# Patient Record
Sex: Male | Born: 1935 | Race: White | Hispanic: No | Smoking: Never smoker
Health system: Southern US, Community
[De-identification: ages and names within clinical notes are randomized; demographics above are authoritative.]

## PROBLEM LIST (undated history)

## (undated) DIAGNOSIS — F0390 Unspecified dementia without behavioral disturbance: Secondary | ICD-10-CM

## (undated) DIAGNOSIS — E785 Hyperlipidemia, unspecified: Secondary | ICD-10-CM

## (undated) HISTORY — DX: Hyperlipidemia, unspecified: E78.5

## (undated) HISTORY — DX: Unspecified dementia, unspecified severity, without behavioral disturbance, psychotic disturbance, mood disturbance, and anxiety: F03.90

---

## 2003-11-24 ENCOUNTER — Other Ambulatory Visit: Payer: Self-pay

## 2003-11-26 ENCOUNTER — Other Ambulatory Visit: Payer: Self-pay

## 2006-01-23 ENCOUNTER — Ambulatory Visit: Payer: Self-pay | Admitting: Urology

## 2006-02-03 ENCOUNTER — Ambulatory Visit: Payer: Self-pay | Admitting: Radiation Oncology

## 2006-03-28 ENCOUNTER — Ambulatory Visit: Payer: Self-pay | Admitting: Specialist

## 2006-04-11 ENCOUNTER — Ambulatory Visit: Payer: Self-pay | Admitting: Radiation Oncology

## 2006-04-19 ENCOUNTER — Ambulatory Visit: Payer: Self-pay | Admitting: Radiation Oncology

## 2006-04-28 ENCOUNTER — Ambulatory Visit: Payer: Self-pay | Admitting: Specialist

## 2006-05-20 ENCOUNTER — Ambulatory Visit: Payer: Self-pay | Admitting: Radiation Oncology

## 2006-06-19 ENCOUNTER — Ambulatory Visit: Payer: Self-pay | Admitting: Radiation Oncology

## 2006-07-20 ENCOUNTER — Ambulatory Visit: Payer: Self-pay | Admitting: Radiation Oncology

## 2006-08-19 ENCOUNTER — Ambulatory Visit: Payer: Self-pay | Admitting: Radiation Oncology

## 2006-11-22 ENCOUNTER — Ambulatory Visit: Payer: Self-pay | Admitting: Radiation Oncology

## 2006-12-19 ENCOUNTER — Ambulatory Visit: Payer: Self-pay | Admitting: Radiation Oncology

## 2007-02-02 ENCOUNTER — Ambulatory Visit: Payer: Self-pay | Admitting: Surgery

## 2007-02-02 ENCOUNTER — Other Ambulatory Visit: Payer: Self-pay

## 2007-02-09 ENCOUNTER — Ambulatory Visit: Payer: Self-pay | Admitting: Surgery

## 2007-05-21 ENCOUNTER — Ambulatory Visit: Payer: Self-pay | Admitting: Radiation Oncology

## 2007-05-24 ENCOUNTER — Ambulatory Visit: Payer: Self-pay | Admitting: Radiation Oncology

## 2007-06-20 ENCOUNTER — Ambulatory Visit: Payer: Self-pay | Admitting: Radiation Oncology

## 2007-11-18 ENCOUNTER — Ambulatory Visit: Payer: Self-pay | Admitting: Radiation Oncology

## 2007-11-26 ENCOUNTER — Ambulatory Visit: Payer: Self-pay | Admitting: Radiation Oncology

## 2007-12-19 ENCOUNTER — Ambulatory Visit: Payer: Self-pay | Admitting: Radiation Oncology

## 2009-07-27 ENCOUNTER — Ambulatory Visit: Payer: Self-pay | Admitting: Family Medicine

## 2009-08-10 ENCOUNTER — Emergency Department: Payer: Self-pay | Admitting: Unknown Physician Specialty

## 2010-12-08 ENCOUNTER — Inpatient Hospital Stay: Payer: Self-pay | Admitting: Internal Medicine

## 2012-07-23 ENCOUNTER — Ambulatory Visit: Payer: Self-pay | Admitting: Family Medicine

## 2013-01-31 ENCOUNTER — Emergency Department: Payer: Self-pay | Admitting: Emergency Medicine

## 2013-05-23 ENCOUNTER — Encounter: Payer: Self-pay | Admitting: General Surgery

## 2013-05-23 ENCOUNTER — Ambulatory Visit (INDEPENDENT_AMBULATORY_CARE_PROVIDER_SITE_OTHER): Payer: Medicare Other | Admitting: General Surgery

## 2013-05-23 VITALS — BP 136/72 | HR 50 | Resp 12 | Ht 69.0 in | Wt 160.0 lb

## 2013-05-23 DIAGNOSIS — L0231 Cutaneous abscess of buttock: Secondary | ICD-10-CM | POA: Insufficient documentation

## 2013-05-23 NOTE — Progress Notes (Signed)
Patient ID: Jerome Hines, male   DOB: [birth year], 78 y.o.   MRN: [id number]  Chief Complaint  Patient presents with  . Other    perirectal abscess    HPI [name] is a 78 y.o. male.  Here today for perirectal abscess for about 1 week.  Some rectal pain and bleeding.  Denies nausea, vomiting or diarrhea. He is on a stool softener but does have trouble with constipation. He is a resident of [facility] in memory care. He has dementia and is accompanied by his daughter who is his POA.  HPI  Past Medical History  Diagnosis Date  . Dementia   . Hyperlipidemia     History reviewed. No pertinent past surgical history.  Family History  Problem Relation Age of Onset  . Stroke Mother     Social History History  Substance Use Topics  . Smoking status: Never Smoker   . Smokeless tobacco: Not on file  . Alcohol Use: No    No Known Allergies  Current Outpatient Prescriptions  Medication Sig Dispense Refill  . acetaminophen (TYLENOL) 650 MG CR tablet Take 650 mg by mouth every 8 (eight) hours as needed for pain.      Marland Kitchen aluminum-magnesium hydroxide 200-200 MG/5ML suspension Take 15 mLs by mouth every 6 (six) hours as needed for indigestion.      Marland Kitchen aspirin 81 MG chewable tablet Chew 81 mg by mouth daily.      Marland Kitchen atorvastatin (LIPITOR) 20 MG tablet Take 20 mg by mouth daily.      . citalopram (CELEXA) 20 MG tablet Take 20 mg by mouth daily.      . divalproex (DEPAKOTE SPRINKLE) 125 MG capsule Take 125 mg by mouth 2 (two) times daily.      Marland Kitchen docusate (COLACE) 60 MG/15ML syrup Take 60 mg by mouth daily.      Marland Kitchen guaiFENesin-codeine (ROBITUSSIN AC) 100-10 MG/5ML syrup Take 5 mLs by mouth 3 (three) times daily as needed for cough.      . levothyroxine (SYNTHROID, LEVOTHROID) 100 MCG tablet Take 100 mcg by mouth daily before breakfast.      . loperamide (IMODIUM A-D) 2 MG tablet Take 2 mg by mouth 4 (four) times daily as needed for diarrhea or loose stools.      . Magnesium Hydroxide  (MILK OF MAGNESIA PO) Take by mouth.      . meloxicam (MOBIC) 7.5 MG tablet Take 7.5 mg by mouth daily.      . memantine (NAMENDA TITRATION PACK) tablet pack Take by mouth See admin instructions. 5 mg/day for =1 week; 5 mg twice daily for =1 week; 15 mg/day given in 5 mg and 10 mg separated doses for =1 week; then 10 mg twice daily      . metoprolol (LOPRESSOR) 50 MG tablet Take 50 mg by mouth 2 (two) times daily.      . rivastigmine (EXELON) 4.6 mg/24hr Place 1 patch onto the skin daily.      . tamsulosin (FLOMAX) 0.4 MG CAPS capsule Take by mouth.       No current facility-administered medications for this visit.    Review of Systems Review of Systems  Constitutional: Negative.   Respiratory: Negative.   Cardiovascular: Negative.   Gastrointestinal: Positive for rectal pain.    Blood pressure 136/72, pulse 50, resp. rate 12, height 5\' 9"  (1.753 m), weight 160 lb (72.576 kg).  Physical Exam Physical Exam  Constitutional: He appears well-developed and well-nourished.  Genitourinary:     Neurological: He is alert.  Pleasantly confused.  Skin: Skin is warm and dry.    Data Reviewed None  Assessment    Abscess in the right gluteal region.      Plan    Abscess was drained today. Antibiotic prescribed. Patient to return as needed.      With pt in left lateral position, the abscess area was prepped with betadine. 1 ml of 1% xylocaine instilled. 1cm incision made and 2-3 ml of blood stained seropurulent material drained. Dressed with 4/4s. Doxycycline 100 mg bid for one week. Toyna Erisman G 05/23/2013, 6:37 PM

## 2013-05-23 NOTE — Patient Instructions (Addendum)
Patient to return as needed. 

## 2014-05-06 ENCOUNTER — Emergency Department: Payer: Self-pay | Admitting: Emergency Medicine

## 2014-05-06 LAB — TROPONIN I

## 2014-05-06 LAB — CBC
HCT: 35.6 % — AB (ref 40.0–52.0)
HGB: 11.8 g/dL — ABNORMAL LOW (ref 13.0–18.0)
MCH: 34 pg (ref 26.0–34.0)
MCHC: 33.1 g/dL (ref 32.0–36.0)
MCV: 103 fL — AB (ref 80–100)
PLATELETS: 200 10*3/uL (ref 150–440)
RBC: 3.47 10*6/uL — ABNORMAL LOW (ref 4.40–5.90)
RDW: 12.5 % (ref 11.5–14.5)
WBC: 6.5 10*3/uL (ref 3.8–10.6)

## 2014-05-06 LAB — BASIC METABOLIC PANEL
ANION GAP: 10 (ref 7–16)
BUN: 26 mg/dL — ABNORMAL HIGH (ref 7–18)
Calcium, Total: 8.2 mg/dL — ABNORMAL LOW (ref 8.5–10.1)
Chloride: 106 mmol/L (ref 98–107)
Co2: 28 mmol/L (ref 21–32)
Creatinine: 1.22 mg/dL (ref 0.60–1.30)
EGFR (African American): >60
EGFR (Non-African Amer.): 57 — ABNORMAL LOW
Glucose: 88 mg/dL (ref 65–99)
OSMOLALITY: 291 (ref 275–301)
Potassium: 4.2 mmol/L (ref 3.5–5.1)
SODIUM: 144 mmol/L (ref 136–145)

## 2014-06-20 ENCOUNTER — Emergency Department: Payer: Self-pay | Admitting: Emergency Medicine

## 2014-06-20 LAB — CBC WITH DIFFERENTIAL/PLATELET
Basophil #: 0.1 10*3/uL (ref 0.0–0.1)
Basophil %: 0.8 %
EOS PCT: 3.6 %
Eosinophil #: 0.2 10*3/uL (ref 0.0–0.7)
HCT: 39.3 % — ABNORMAL LOW (ref 40.0–52.0)
HGB: 13.1 g/dL (ref 13.0–18.0)
LYMPHS ABS: 1.4 10*3/uL (ref 1.0–3.6)
Lymphocyte %: 20.7 %
MCH: 34 pg (ref 26.0–34.0)
MCHC: 33.4 g/dL (ref 32.0–36.0)
MCV: 102 fL — AB (ref 80–100)
Monocyte #: 0.7 x10 3/mm (ref 0.2–1.0)
Monocyte %: 10.7 %
NEUTROS ABS: 4.3 10*3/uL (ref 1.4–6.5)
NEUTROS PCT: 64.2 %
PLATELETS: 221 10*3/uL (ref 150–440)
RBC: 3.85 10*6/uL — ABNORMAL LOW (ref 4.40–5.90)
RDW: 12.5 % (ref 11.5–14.5)
WBC: 6.6 10*3/uL (ref 3.8–10.6)

## 2014-06-20 LAB — BASIC METABOLIC PANEL
Anion Gap: 8 (ref 7–16)
BUN: 28 mg/dL — ABNORMAL HIGH (ref 7–18)
CO2: 28 mmol/L (ref 21–32)
Calcium, Total: 8.4 mg/dL — ABNORMAL LOW (ref 8.5–10.1)
Chloride: 107 mmol/L (ref 98–107)
Creatinine: 1.19 mg/dL (ref 0.60–1.30)
EGFR (African American): >60
EGFR (Non-African Amer.): >60
Glucose: 89 mg/dL (ref 65–99)
OSMOLALITY: 290 (ref 275–301)
Potassium: 4.4 mmol/L (ref 3.5–5.1)
SODIUM: 143 mmol/L (ref 136–145)

## 2014-06-20 LAB — TROPONIN I

## 2014-07-17 ENCOUNTER — Emergency Department: Payer: Self-pay | Admitting: Emergency Medicine

## 2014-07-17 LAB — URINALYSIS, COMPLETE
BILIRUBIN, UR: NEGATIVE
Blood: NEGATIVE
GLUCOSE, UR: NEGATIVE mg/dL (ref 0–75)
KETONE: NEGATIVE
LEUKOCYTE ESTERASE: NEGATIVE
NITRITE: NEGATIVE
PH: 7 (ref 4.5–8.0)
Protein: NEGATIVE
RBC,UR: 3 /HPF (ref 0–5)
Specific Gravity: 1.019 (ref 1.003–1.030)
Squamous Epithelial: NONE SEEN

## 2014-07-17 LAB — BASIC METABOLIC PANEL
ANION GAP: 7 (ref 7–16)
BUN: 26 mg/dL — AB (ref 7–18)
CALCIUM: 7.9 mg/dL — AB (ref 8.5–10.1)
CHLORIDE: 108 mmol/L — AB (ref 98–107)
CREATININE: 1.1 mg/dL (ref 0.60–1.30)
Co2: 29 mmol/L (ref 21–32)
EGFR (African American): >60
Glucose: 90 mg/dL (ref 65–99)
OSMOLALITY: 291 (ref 275–301)
Potassium: 4.2 mmol/L (ref 3.5–5.1)
SODIUM: 144 mmol/L (ref 136–145)

## 2014-07-17 LAB — TROPONIN I: Troponin-I: 0.02 ng/mL

## 2014-07-17 LAB — CBC
HCT: 36.4 % — ABNORMAL LOW (ref 40.0–52.0)
HGB: 12.3 g/dL — ABNORMAL LOW (ref 13.0–18.0)
MCH: 34.1 pg — AB (ref 26.0–34.0)
MCHC: 33.9 g/dL (ref 32.0–36.0)
MCV: 101 fL — AB (ref 80–100)
PLATELETS: 184 10*3/uL (ref 150–440)
RBC: 3.62 10*6/uL — ABNORMAL LOW (ref 4.40–5.90)
RDW: 12.6 % (ref 11.5–14.5)
WBC: 6.9 10*3/uL (ref 3.8–10.6)

## 2014-08-30 ENCOUNTER — Emergency Department: Payer: Self-pay | Admitting: Emergency Medicine

## 2014-08-30 LAB — COMPREHENSIVE METABOLIC PANEL
ANION GAP: 6 — AB (ref 7–16)
Albumin: 3.2 g/dL — ABNORMAL LOW (ref 3.4–5.0)
Alkaline Phosphatase: 95 U/L
BILIRUBIN TOTAL: 0.7 mg/dL (ref 0.2–1.0)
BUN: 21 mg/dL — AB (ref 7–18)
CALCIUM: 8.4 mg/dL — AB (ref 8.5–10.1)
Chloride: 105 mmol/L (ref 98–107)
Co2: 29 mmol/L (ref 21–32)
Creatinine: 1.16 mg/dL (ref 0.60–1.30)
EGFR (African American): >60
EGFR (Non-African Amer.): >60
Glucose: 90 mg/dL (ref 65–99)
Osmolality: 282 (ref 275–301)
POTASSIUM: 4.1 mmol/L (ref 3.5–5.1)
SGOT(AST): 28 U/L (ref 15–37)
SGPT (ALT): 15 U/L
Sodium: 140 mmol/L (ref 136–145)
Total Protein: 7.6 g/dL (ref 6.4–8.2)

## 2014-08-30 LAB — CBC
HCT: 41.6 % (ref 40.0–52.0)
HGB: 13.9 g/dL (ref 13.0–18.0)
MCH: 34 pg (ref 26.0–34.0)
MCHC: 33.5 g/dL (ref 32.0–36.0)
MCV: 102 fL — AB (ref 80–100)
Platelet: 195 10*3/uL (ref 150–440)
RBC: 4.09 10*6/uL — AB (ref 4.40–5.90)
RDW: 13.2 % (ref 11.5–14.5)
WBC: 9.4 10*3/uL (ref 3.8–10.6)

## 2014-08-30 LAB — URINALYSIS, COMPLETE
BACTERIA: NONE SEEN
BLOOD: NEGATIVE
Bilirubin,UR: NEGATIVE
GLUCOSE, UR: NEGATIVE mg/dL (ref 0–75)
Ketone: NEGATIVE
Leukocyte Esterase: NEGATIVE
NITRITE: NEGATIVE
Ph: 5 (ref 4.5–8.0)
Protein: NEGATIVE
RBC,UR: 4 /HPF (ref 0–5)
SPECIFIC GRAVITY: 1.023 (ref 1.003–1.030)
Squamous Epithelial: NONE SEEN
WBC UR: 1 /HPF (ref 0–5)

## 2014-08-30 LAB — TROPONIN I: TROPONIN-I: 0.03 ng/mL

## 2014-11-06 ENCOUNTER — Inpatient Hospital Stay: Payer: Self-pay | Admitting: Internal Medicine

## 2014-11-11 DIAGNOSIS — I2510 Atherosclerotic heart disease of native coronary artery without angina pectoris: Secondary | ICD-10-CM

## 2014-11-11 DIAGNOSIS — N400 Enlarged prostate without lower urinary tract symptoms: Secondary | ICD-10-CM

## 2014-11-11 DIAGNOSIS — G301 Alzheimer's disease with late onset: Secondary | ICD-10-CM

## 2014-11-11 DIAGNOSIS — S7292XA Unspecified fracture of left femur, initial encounter for closed fracture: Secondary | ICD-10-CM

## 2014-11-11 DIAGNOSIS — F419 Anxiety disorder, unspecified: Secondary | ICD-10-CM

## 2014-11-25 ENCOUNTER — Emergency Department: Payer: Self-pay | Admitting: Emergency Medicine

## 2014-11-26 DIAGNOSIS — R0602 Shortness of breath: Secondary | ICD-10-CM | POA: Diagnosis not present

## 2014-11-26 DIAGNOSIS — R079 Chest pain, unspecified: Secondary | ICD-10-CM | POA: Diagnosis not present

## 2014-12-16 DIAGNOSIS — N400 Enlarged prostate without lower urinary tract symptoms: Secondary | ICD-10-CM | POA: Diagnosis not present

## 2014-12-16 DIAGNOSIS — G301 Alzheimer's disease with late onset: Secondary | ICD-10-CM | POA: Diagnosis not present

## 2014-12-16 DIAGNOSIS — F0390 Unspecified dementia without behavioral disturbance: Secondary | ICD-10-CM | POA: Diagnosis not present

## 2014-12-16 DIAGNOSIS — I2510 Atherosclerotic heart disease of native coronary artery without angina pectoris: Secondary | ICD-10-CM | POA: Diagnosis not present

## 2014-12-16 DIAGNOSIS — F418 Other specified anxiety disorders: Secondary | ICD-10-CM

## 2015-01-07 DIAGNOSIS — F419 Anxiety disorder, unspecified: Secondary | ICD-10-CM

## 2015-01-07 DIAGNOSIS — G309 Alzheimer's disease, unspecified: Secondary | ICD-10-CM | POA: Diagnosis not present

## 2015-01-07 DIAGNOSIS — E039 Hypothyroidism, unspecified: Secondary | ICD-10-CM | POA: Diagnosis not present

## 2015-01-07 DIAGNOSIS — I2510 Atherosclerotic heart disease of native coronary artery without angina pectoris: Secondary | ICD-10-CM | POA: Diagnosis not present

## 2015-01-07 DIAGNOSIS — N6009 Solitary cyst of unspecified breast: Secondary | ICD-10-CM | POA: Diagnosis not present

## 2015-01-18 NOTE — H&P (Signed)
PATIENT NAME:  Jerome Hines, Jerome Hines MR#:  [id number] DATE OF BIRTH:  23-Jun-1935  DATE OF ADMISSION:  11/05/2014  REFERRING PHYSICIAN: Darien Ramusavid W. Kaminski, MD  PRIMARY CARE PHYSICIAN: Richard L. Sullivan LoneGilbert, MD   CHIEF COMPLAINT: Fall   HISTORY OF PRESENT ILLNESS: A 80 year old Caucasian gentleman with history of advanced dementia; hypothyroidism, unspecified; coronary artery disease, status post CABG presenting after mechanical fall. The patient resides at [facility]. Unable to provide any meaningful information given mental status and medical condition. Had a witnessed fall today. Then noticed grimacing with him to move his leg. Went to the Emergency Department for further workup and evaluation and found to have a left intertrochanteric fracture. Once again, the patient is unable to provide any meaningful information given mental status and medical condition.   REVIEW OF SYSTEMS: Unobtainable given mental status and medical condition.   PAST MEDICAL HISTORY: Advanced dementia, hypothyroidism, coronary artery disease status post CABG.   SOCIAL HISTORY: Per documentation, he is ambulatory, confused at baseline; however, is somewhat verbal. He is incontinent at baseline as well.   FAMILY HISTORY: Unobtainable, given the patient's mental status and medical condition.   ALLERGIES: No known drug allergies.   HOME MEDICATIONS: Include aspirin 81 mg p.Hines. daily, Tylenol 500 mg p.Hines. q. 4 hours as needed for pain, meloxicam 7.5 mg p.Hines. daily, milk of magnesia 30 mL as needed for constipation, Flomax 0.4 mg p.Hines. daily, divalproex 125 mg p.Hines. at bedtime, citalopram 20 mg p.Hines. daily, loratadine 10 mg p.Hines. daily, atorvastatin 20 mg p.Hines. daily, metoprolol 50 mg p.Hines. daily, Exelon patch 13.3 mg transdermal daily, nystatin topical apply to affected area b.i.d., Colace 10 mL daily, Namenda extended-release 28 mg daily, levothyroxine 100 mcg p.Hines. daily.   PHYSICAL EXAMINATION:  VITAL SIGNS: Temperature  97.6, heart rate 69, respirations 22, blood pressure 151/81, saturating 100% on room air. Weight 68 kg, BMI 20.4.  GENERAL: A weak -appearing Caucasian gentleman, currently in no acute distress.  HEAD: Normocephalic, atraumatic.  EYES: Pupils equal, round, and reactive to light. Extraocular muscles intact. No scleral icterus.  MOUTH: Dry mucosal membrane. Dentition intact. No abscess noted.  EAR, NOSE, AND THROAT: Clear without exudates. No external lesions.  NECK: Supple. No thyromegaly. No nodules. No JVD.  PULMONARY: Clear to auscultation bilaterally without wheezes, rales, or rhonchi. No use of accessory muscles. Good respiratory effort.  CHEST: Nontender to palpation.  CARDIOVASCULAR: S1, S2, regular rate and rhythm. There is a 3/6 systolic ejection murmur best heard at the left upper sternal border. No edema noted. Pedal pulses 2+ bilaterally.  GASTROINTESTINAL: Soft, nontender, nondistended. No masses. Positive bowel sounds. No hepatosplenomegaly.  MUSCULOSKELETAL: No swelling, clubbing, or edema. Left lower extremity is currently internally rotated, range of motion limited secondary to pain.  NEUROLOGIC: Unable to fully assess given the patient's mental status and medical condition. He is unable to follow simple commands.  SKIN: No ulceration, lesions, rashes, or cyanosis. Skin warm, dry. Turgor intact.   PSYCHIATRIC: Unable to fully assess given the patient's mental status and medical condition. Mood, affect flat. He is awake; however, he is currently nonverbal.   LABORATORY AND RADIOGRAPHIC DATA: X-ray of the left femur reveals mildly displaced proximal left intertrochanteric fracture. Chest x-ray reveals mild cardiomegaly with mild chronic interstitial lung disease, no acute findings. EKG performed with evidence of LVH with first degree AV block, no ST-T wave abnormalities. Remainder of laboratory data: Sodium 142, potassium 3.9, chloride 107, bicarbonate 32, BUN 24, creatinine 1.29,  glucose 97,  albumin 2.9, WBC 9.3, hemoglobin of 12.7.   ASSESSMENT AND PLAN: A 80 year old Caucasian male with a history of advanced dementia, presenting after a fall.  1.  Preoperative evaluation for open reduction internal fixation of left intertrochanteric fracture. We will consult orthopedics. The case has already been discussed in the Emergency Department. The patient should be considered a moderate risk for moderate risk surgery from a cardiac standpoint. Has no active of congestive heart failure, arrhythmias, does have a 3/6 systolic ejection murmur, however, without any evidence of cardiac compromise secondary to his valvular dysfunction. His METs considered less than 4 given generalized dysmobility. No further testing or treatment is required prior to surgery.  2.  Coronary artery disease status post coronary artery bypass grafting. Hold his aspirin preoperatively, may restart post. Continue with statin and beta blockade.  3.  Hypothyroidism, unspecified: Continue with Synthroid.  4.  Dementia. Continue with home dosages of medications including Exelon patch as well as Namenda.  5.  Venous thromboembolism prophylaxis. Currently sequential compression devices.   CODE STATUS: The patient is full code.   TIME SPENT: 45 minutes.    ____________________________ Cletis Athens. Rumeal Cullipher, MD dkh:bm D: 11/05/2014 23:58:00 ET T: 11/06/2014 00:23:48 ET JOB#: [id number]  cc: Cletis Athens. Jehiel Koepp, MD, <Dictator> Diesha Rostad Synetta Shadow MD ELECTRONICALLY SIGNED 11/12/2014 21:09

## 2015-01-18 NOTE — Discharge Summary (Signed)
PATIENT NAME:  Jerome Hines, Jerome Hines MR#:  [id number] DATE OF BIRTH:  [birth year]  DATE OF ADMISSION:  11/06/2014 DATE OF DISCHARGE:  11/10/2014  PRESENTING COMPLAINT: Fall.  PRIMARY CARE PHYSICIAN: Jerome Mansonichard Gilbert, MD  DISCHARGE DIAGNOSIS: Left hip intertrochanteric fracture status post nailing/pinning, by Dr. Hyacinth Hines.  CODE STATUS: Full.  DISCHARGE MEDICATIONS: 1.  Atorvastatin 20 mg p.Hines. daily.  2.  Citalopram 20 mg daily.  3.  Docusate 10 mL p.Hines. daily.  4.  Exelon transdermal patch daily.  5.  Levothyroxine 100 mcg daily.  6.  Loratadine 10 mg daily.  7.  Metoprolol extended release 50 mg daily.  8.  Namenda XR 28 mg at bedtime.  9.  Tamsulosin 0.4 mg daily.  10.  Milk of magnesia 30 mL at bedtime as needed.  11.  Nystatin/triamcinolone topical cream apply to effected area b.i.d. as needed.  12.  Riesman topical ointment to effected area b.i.d.  13.  Acetaminophen 325 mg 2 tablets every 4 hours as needed.  14.  Alendronate 70 mg once a week.  15.  Ferrous sulfate 325 b.i.d.  16.  Calcium with vitamin D 500/200 one tablet b.i.d.  17.  Aspirin 325 b.i.d. for 6 weeks then 81 mg daily. This is recommendation by Dr. Hyacinth Hines for DVT prophylaxis.   DISCHARGE INSTRUCTIONS: 1.  Physical therapy.  2.  Follow up with Jerome Hines in 1 to 2 weeks.  3.  Follow up with Dr. Julieanne Mansonichard Hines after discharge from rehab.   DIAGNOSTIC DATA: Hemoglobin at discharge was 10.6.   BRIEF SUMMARY OF HOSPITAL COURSE: Jerome Hines is a 80 year old patient with advanced dementia and high blood pressure who comes in with fall and hip fracture. He was admitted with:  1.  Left hip intertrochanteric fracture status post fall, postop day 4, doing well. He is status post nailing/pinning by Dr. Hyacinth Hines. Dr. Hyacinth Hines recommends aspirin 325 b.i.d. for 6 weeks then resume 81 mg daily. The patient received Lovenox for DVT prophylaxis in the hospital. Physical therapy recommends rehab. [facility].  2.  Dementia. Continue Namenda and Exelon patch.  3.  Depression, mood disorder. On Depakote and Celexa.  4.  Hypothyroidism. On Synthroid.  5.  Coronary artery disease status post CABG. Stable. Continue Toprol and aspirin.   Hospital stay otherwise remained stable. The patient remained a FULL code. The discharge plan was discussed with Dr. Hyacinth Hines and the patient's daughter who was present in the room.   TIME SPENT: 40 minutes.   ____________________________ Jerome HailSona A. Allena KatzPatel, MD sap:sb D: 11/10/2014 10:30:54 ET T: 11/10/2014 11:16:11 ET JOB#: [id number]  cc: Jerome Hines A. Allena KatzPatel, MD, <Dictator> Jerome L. Sullivan LoneGilbert, MD Jerome OraSONA A Banjamin Stovall MD ELECTRONICALLY SIGNED 11/18/2014 17:33

## 2015-01-18 NOTE — Consult Note (Signed)
Brief Consult Note: Diagnosis: Left intertrochanteric hip fracture.   Patient was seen by consultant.   Recommend to proceed with surgery or procedure.   Recommend further assessment or treatment.   Orders entered.   Comments: 80 year old male with dementia has suffered a left intertrochanteric hip fracture.  Have discussed treatment with the patient's daughter who wishes to proceed with surgical fixation of the fracture. I have had a long history with the patient who tries to remain active.  Risks and benefits of surgery were discussed at length including but not limited to infection, non union, nerve or blood vessed damage, non union, need for repeat surgery, blood clots and lung emboli, and death.   Exam:  Sleepy but arousable. circulation/sensation/motor function good left leg.  Leg short and rotated with pain on range of motion.  Skin intact.  No other injury noted.    X-rays: as above  Rx:  open reduction and internal fixation left hip.  Electronic Signatures: Valinda HoarMiller, Tyann Niehaus E (MD)  (Signed 18-Feb-16 09:06)  Authored: Brief Consult Note   Last Updated: 18-Feb-16 09:06 by Valinda HoarMiller, Garlon Tuggle E (MD)

## 2015-01-18 NOTE — Op Note (Signed)
PATIENT NAME:  Jerome Hines, Jerome Hines MR#:  [id number] DATE OF BIRTH:  01/25/1935  DATE OF PROCEDURE:  11/06/2014  PREOPERATIVE DIAGNOSIS: Displaced intertrochanteric fracture of the left hip.   POSTOPERATIVE DIAGNOSIS:  Displaced intertrochanteric fracture of the left hip.  PROCEDURE PERFORMED: Open reduction internal fixation left hip with a Synthes trochanteric fixation nail (130 degrees/11 mm rod, 105 mm helical blade, 40 mm distal screw).   SURGEON: Valinda HoarHoward E. Arlyce Circle, MD   ANESTHESIA: Spinal.   COMPLICATIONS: None.   DRAINS: None.   ESTIMATED BLOOD LOSS: 100 mL.  REPLACEMENT: None.   DESCRIPTION OF PROCEDURE: The patient was brought to the Operating Room where he underwent satisfactory spinal anesthesia, was placed in the supine position and padded appropriately on the fracture table. The right leg was flexed and abducted and the left leg was placed in traction and internally rotated. Fluoroscopy showed good alignment of the fracture for surgery. The hip was prepped and draped in sterile fashion, and a longitudinal incision was made just above the trochanter. A guidepin was inserted and a 17 mm reamer was used to enlarge the opening here. The guidepin was passed down the shaft and 130 degree/11 mm rod was inserted at the appropriate depth. A second incision was made distally and the aiming device inserted and tightened snugly. The guidepin was inserted under fluoroscopic control into good position in the center of the femoral head and neck. The lateral cortex was drilled and the neck was drilled to 105 mm. A 105 mm helical blade was inserted and seated fully with the angulation corrected. The set screw was tightened down from above and fluoroscopy showed the hardware and the fracture site to be in good position. The distal aiming device was inserted and the distal locking screw drilled and filled with a 40 mm screw. Final fluoroscopy showed all hardware and the fracture to be in good alignment. The  outriggers were removed. The wound was irrigated. The deep fascia was closed with 0 Vicryl. Subcutaneous tissue was closed with 2-0 Vicryl, and the skin was closed with staples. Dry sterile dressing was applied. The patient was transferred to his hospital bed and taken to recovery in good condition. There was good range of motion of the hip without any crepitus.    ___________________________ Valinda HoarHoward E. Keyana Guevara, MD hem:mc D: 11/06/2014 12:04:55 ET T: 11/06/2014 12:32:25 ET JOB#: [id number]  cc: Valinda HoarHoward E. Jovanny Stephanie, MD, <Dictator> Valinda HoarHOWARD E Vernal Rutan MD ELECTRONICALLY SIGNED 11/07/2014 13:17

## 2015-01-26 DIAGNOSIS — R21 Rash and other nonspecific skin eruption: Secondary | ICD-10-CM | POA: Diagnosis not present

## 2015-03-04 ENCOUNTER — Ambulatory Visit: Payer: Self-pay | Admitting: Internal Medicine

## 2015-04-08 DIAGNOSIS — N401 Enlarged prostate with lower urinary tract symptoms: Secondary | ICD-10-CM | POA: Diagnosis not present

## 2015-04-08 DIAGNOSIS — F419 Anxiety disorder, unspecified: Secondary | ICD-10-CM

## 2015-04-08 DIAGNOSIS — G309 Alzheimer's disease, unspecified: Secondary | ICD-10-CM | POA: Diagnosis not present

## 2015-04-08 DIAGNOSIS — E039 Hypothyroidism, unspecified: Secondary | ICD-10-CM | POA: Diagnosis not present

## 2015-04-08 DIAGNOSIS — I2510 Atherosclerotic heart disease of native coronary artery without angina pectoris: Secondary | ICD-10-CM | POA: Diagnosis not present

## 2015-06-01 DIAGNOSIS — N400 Enlarged prostate without lower urinary tract symptoms: Secondary | ICD-10-CM | POA: Diagnosis not present

## 2015-06-01 DIAGNOSIS — G309 Alzheimer's disease, unspecified: Secondary | ICD-10-CM | POA: Diagnosis not present

## 2015-06-01 DIAGNOSIS — I2510 Atherosclerotic heart disease of native coronary artery without angina pectoris: Secondary | ICD-10-CM | POA: Diagnosis not present

## 2015-06-01 DIAGNOSIS — F39 Unspecified mood [affective] disorder: Secondary | ICD-10-CM | POA: Diagnosis not present

## 2015-07-03 DIAGNOSIS — L508 Other urticaria: Secondary | ICD-10-CM | POA: Diagnosis not present

## 2015-08-07 DIAGNOSIS — I2510 Atherosclerotic heart disease of native coronary artery without angina pectoris: Secondary | ICD-10-CM

## 2015-08-07 DIAGNOSIS — E039 Hypothyroidism, unspecified: Secondary | ICD-10-CM

## 2015-08-07 DIAGNOSIS — N401 Enlarged prostate with lower urinary tract symptoms: Secondary | ICD-10-CM

## 2015-08-07 DIAGNOSIS — F39 Unspecified mood [affective] disorder: Secondary | ICD-10-CM

## 2015-08-07 DIAGNOSIS — G309 Alzheimer's disease, unspecified: Secondary | ICD-10-CM | POA: Diagnosis not present

## 2015-08-17 IMAGING — CT CT HEAD WITHOUT CONTRAST
3 of 7 series · 13 of 47 positions shown, 15 images · non-contrast
Comparison: 05/06/2014; 12/08/2010

CLINICAL DATA: Unwitnessed fall earlier today. History of dementia.
Initial encounter.

EXAM:
CT HEAD WITHOUT CONTRAST
CT CERVICAL SPINE WITHOUT CONTRAST
TECHNIQUE: Multidetector CT imaging of the head and cervical spine was
performed following the standard protocol without intravenous
contrast. Multiplanar CT image reconstructions of the cervical spine
were also generated.

[Series 11: soft tissue · axial · 0.41mm/px · z∈[-152,-26]mm · 8 of 83 slices shown, 10 images]
[im 10/83  brain]
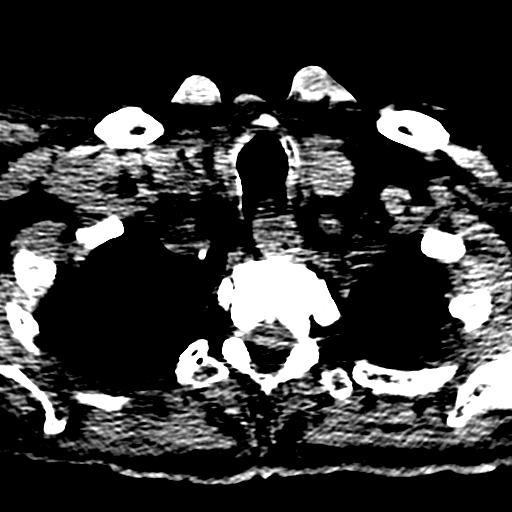
[im 10/83  bone]
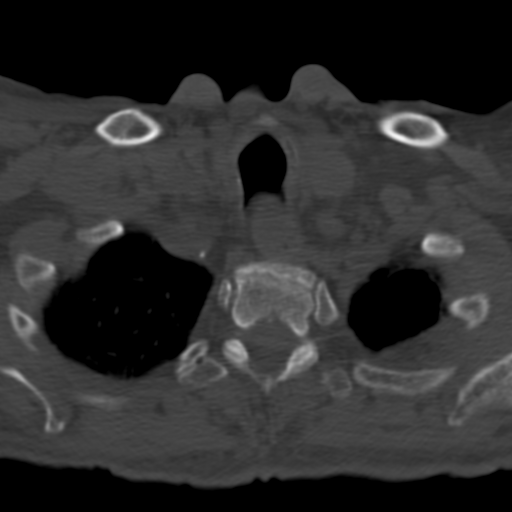
[im 19/83  brain]
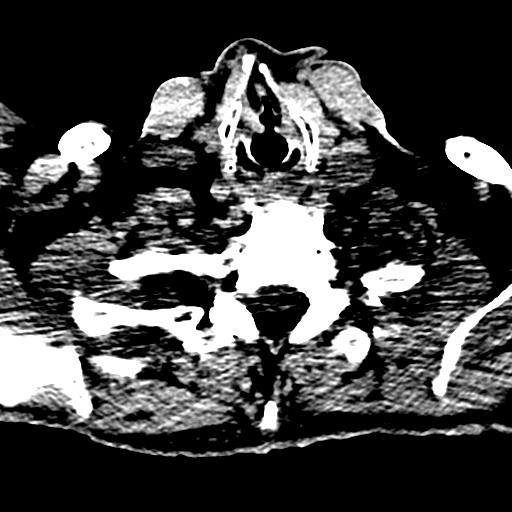
[im 28/83  brain]
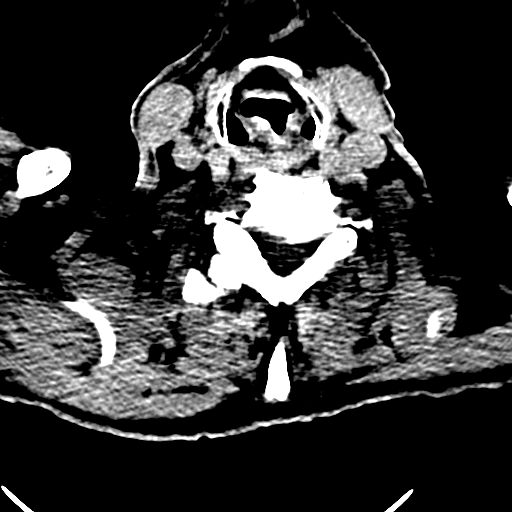
[im 37/83  brain]
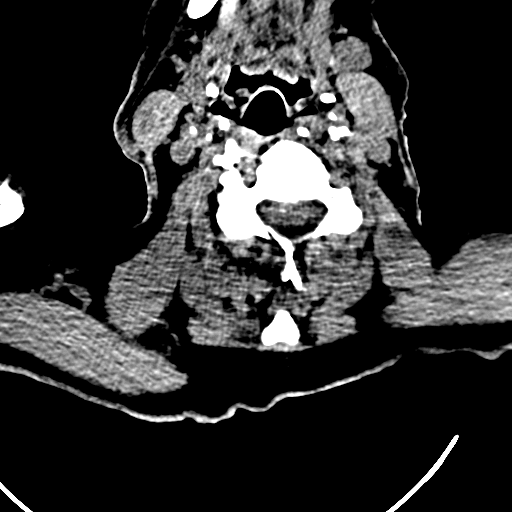
[im 46/83  brain]
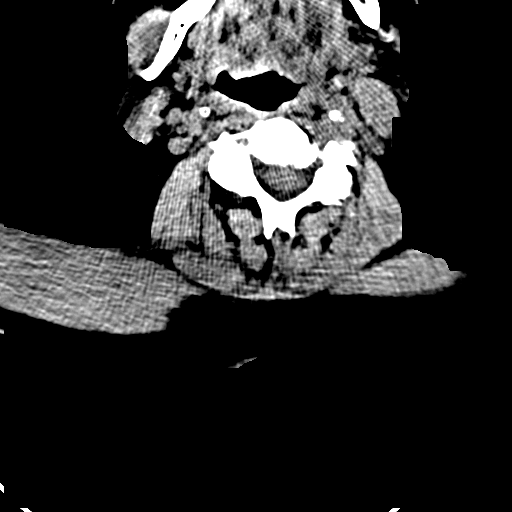
[im 46/83  bone]
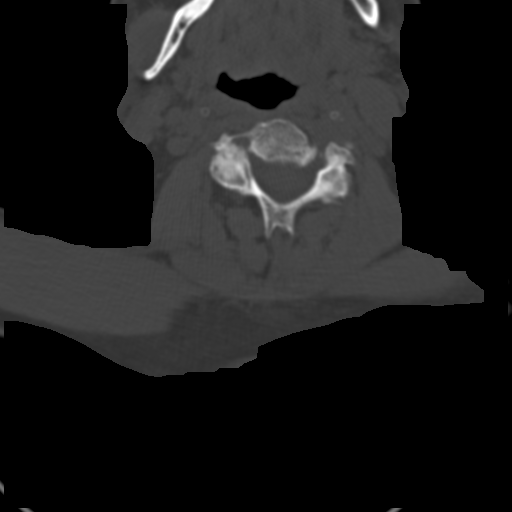
[im 55/83  brain]
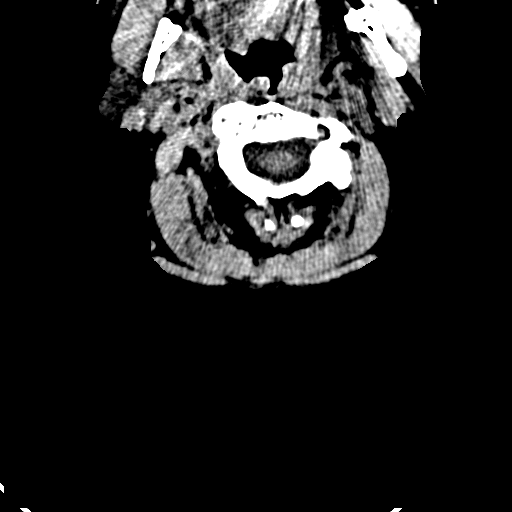
[im 64/83  brain]
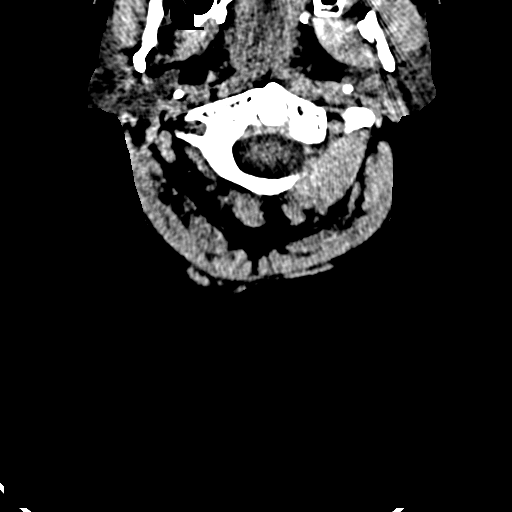
[im 73/83  brain]
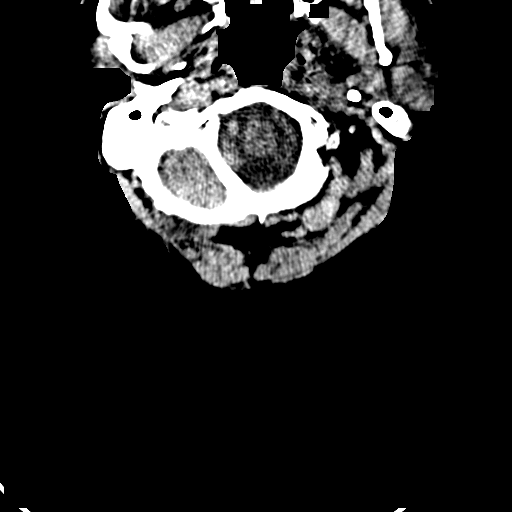

[Series 19: coronal bone · coronal · 0.21mm/px · 3 of 51 slices shown]
[im 17/51  brain]
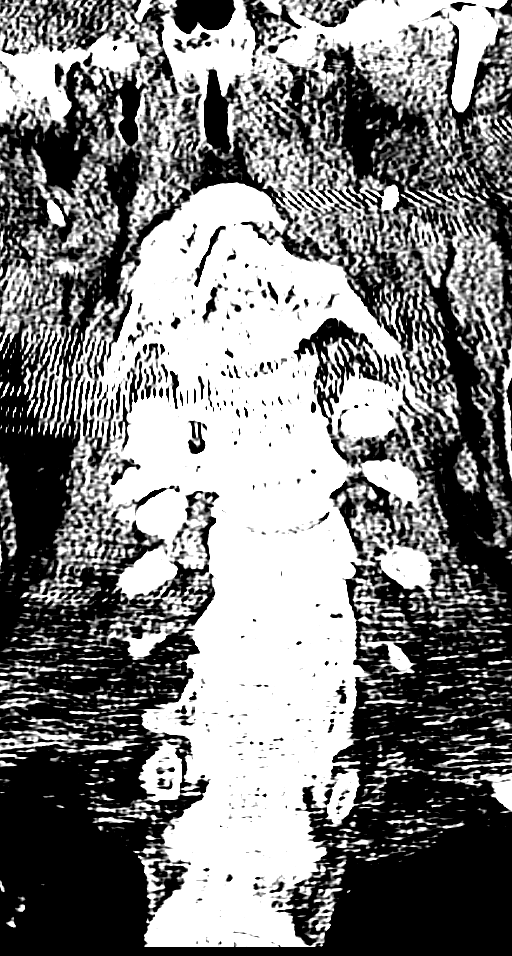
[im 23/51  brain]
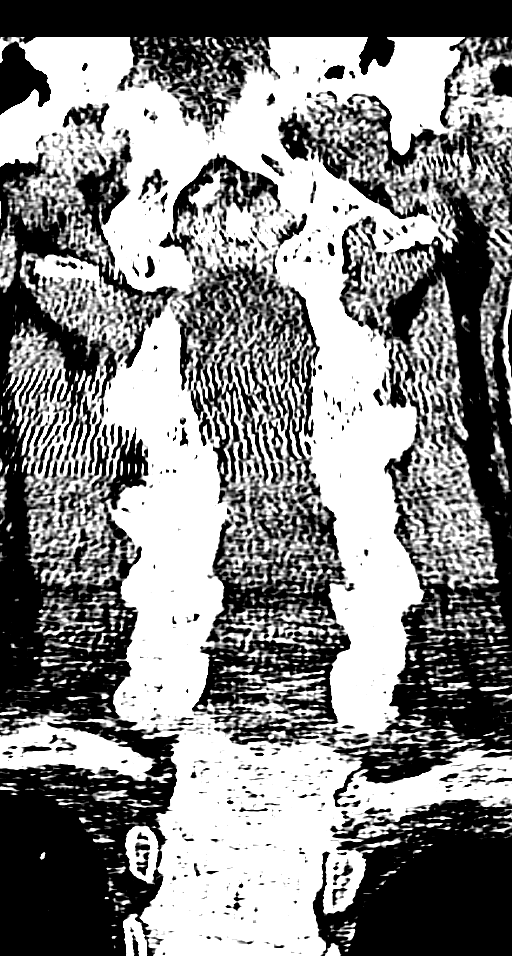
[im 28/51  brain]
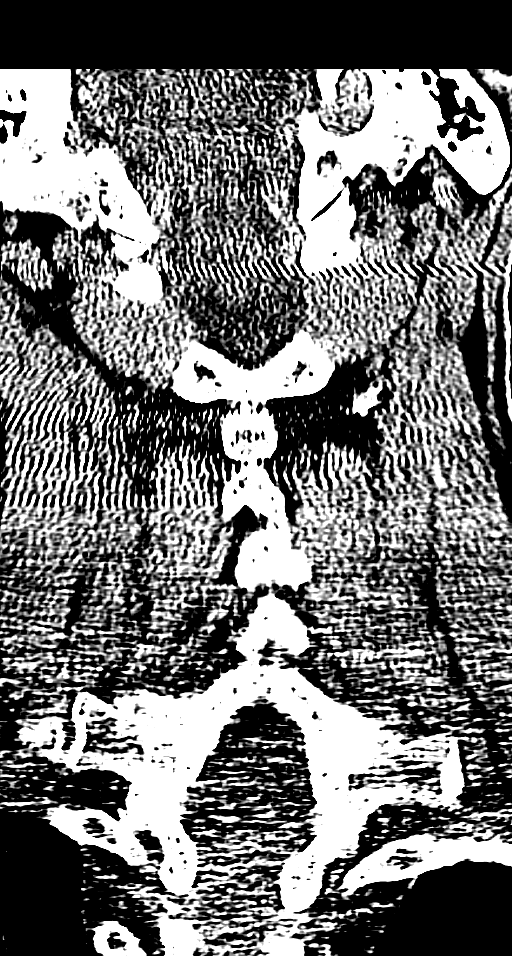

[Series 20: axial · axial · 0.24mm/px · z∈[-181,-165]mm · 2 of 86 slices shown]
[im 10/86  brain]
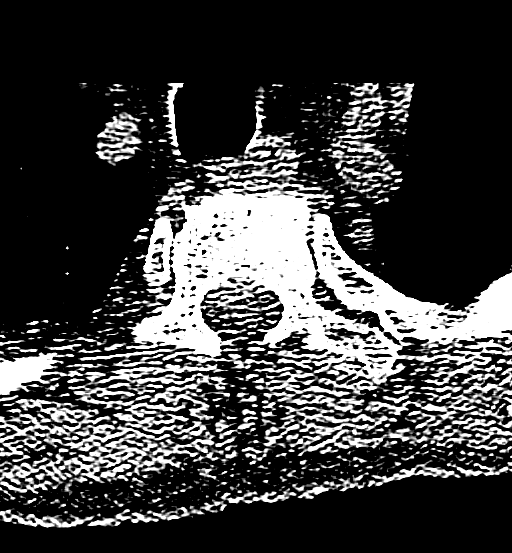
[im 19/86  brain]
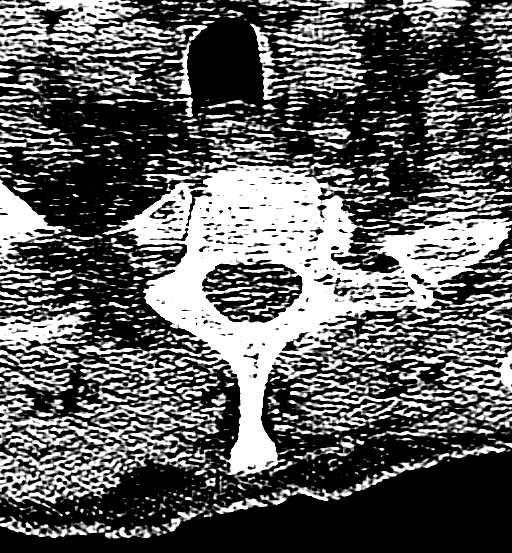

[13 of 47 positions shown; findings below may reference images not displayed]

FINDINGS: CT HEAD FINDINGS

The examination is degraded secondary to patient motion artifact
necessitating the acquisition of additional images.

Similar findings of advanced atrophy with sulcal prominence and
centralized volume loss with marked commensurate ex vacuo dilatation
of the ventricular system. Scattered periventricular hypodensities
compatible with microvascular ischemic disease. Given extensive
background parenchymal abnormalities, there is no CT evidence of
acute intracranial process. No intraparenchymal or extra-axial mass
or hemorrhage. Unchanged size and configuration of the ventricles
and basilar cisterns. No midline shift.

Post ORIF of the anterior limited visualization of the paranasal
sinuses and mastoid air cells are otherwise normal wall of the left
maxillary sinus. Shrapnel is seen within the right cheek and about
the left nasal bone, unchanged.

CT CERVICAL SPINE FINDINGS

C1 to the inferior endplate of T2 is imaged.

There is straightening of the expected cervical lordosis. No
anterolisthesis or retrolisthesis. The bilateral facets are normally
aligned. The dens is normally positioned between the lateral masses
of C1. Normal atlantodental and atlantoaxial articulations.

No fracture or static subluxation of the cervical spine. Cervical
vertebral body heights are preserved. Prevertebral soft tissues are
normal.

There is mild to moderate multilevel cervical spine DDD, worse at
C5-C6 and to a lesser extent, C6-C7 with disc space height loss,
endplate irregularity and posteriorly directed disc osteophyte
complexes at these locations.

Atherosclerotic plaque within the bilateral carotid bulbs. No bulky
cervical lymphadenopathy on this noncontrast examination. The
thyroid gland appears diminutive. Limited visualization of lung
apices is normal.
IMPRESSION: 1. Advanced atrophy and microvascular ischemic disease without
definite acute intracranial process.
2. No fracture or static subluxation of the cervical spine.
3. Mild-to-moderate multilevel cervical spine DDD, worse at C5-C6.

## 2015-10-15 DIAGNOSIS — I2510 Atherosclerotic heart disease of native coronary artery without angina pectoris: Secondary | ICD-10-CM

## 2015-10-15 DIAGNOSIS — N6009 Solitary cyst of unspecified breast: Secondary | ICD-10-CM

## 2015-10-15 DIAGNOSIS — I350 Nonrheumatic aortic (valve) stenosis: Secondary | ICD-10-CM

## 2015-10-15 DIAGNOSIS — G301 Alzheimer's disease with late onset: Secondary | ICD-10-CM | POA: Diagnosis not present

## 2015-10-15 DIAGNOSIS — F39 Unspecified mood [affective] disorder: Secondary | ICD-10-CM

## 2015-10-15 DIAGNOSIS — E039 Hypothyroidism, unspecified: Secondary | ICD-10-CM

## 2015-11-23 ENCOUNTER — Telehealth: Payer: Self-pay

## 2015-11-23 DIAGNOSIS — H00013 Hordeolum externum right eye, unspecified eyelid: Secondary | ICD-10-CM

## 2015-11-23 NOTE — Telephone Encounter (Signed)
PLEASE NOTE: All timestamps contained within this report are represented as [place] Standard Time. CONFIDENTIALTY NOTICE: This fax transmission is intended only for the addressee. It contains information that is legally privileged, confidential or otherwise protected from use or disclosure. If you are not the intended recipient, you are strictly prohibited from reviewing, disclosing, copying using or disseminating any of this information or taking any action in reliance on or regarding this information. If you have received this fax in error, please notify us immediately by telephone so that we can arrange for its return to us. Phone: [phone], Toll-Free: [phone], Fax: [phone] Page: 1 of 1 Call Id: [id number] [facility] [facility] Client TELEPHONE ADVICE RECORD [facility] Patient Name: Jerome DakinsROBERT Hines Gender: Male DOB: 1935-03-03 Age: [age] Y 7 M 14 D Return Phone Number: Address: City/State/Zip: [place] Client  [facility] [place] [facility] [place] Client Site  [facility] [place] - Day Contact Type Call Who Is Calling Patient / Member / Family / Caregiver Call Type Triage / Clinical Caller Name melinda holmes Relationship To Patient Provider Return Phone Number Please choose phone number Chief Complaint Eye Pain Reason for Call Symptomatic / Request for Health Information Initial Comment Caller States melinda w/ [facility], has pt with red swollen area on right inner right eye lid. daughter would like to see if something can get called in. Call Back # [phone] Appointment Disposition EMR Caller Not Reached Info pasted into Epic No Translation No Nurse Assessment Guidelines Guideline Title Affirmed Question Affirmed Notes Nurse Date/Time (Eastern Time) Disp. Time [name](Eastern Time) Disposition Final User 11/21/2015 10:15:33 AM Attempt made - message left Phylliss BobRowe, RN, Synetta FailAnita 11/21/2015 10:35:08 AM FINAL ATTEMPT  MADE - message left Yes Phylliss Bobowe, RN, Synetta FailAnita

## 2015-11-23 NOTE — Telephone Encounter (Signed)
I saw him today and started him on keflex for infected stye

## 2015-11-23 NOTE — Telephone Encounter (Signed)
Unable to reach [facility] at contact # left [phone].

## 2015-11-30 DIAGNOSIS — S31829A Unspecified open wound of left buttock, initial encounter: Secondary | ICD-10-CM | POA: Diagnosis not present

## 2015-12-17 DIAGNOSIS — L89153 Pressure ulcer of sacral region, stage 3: Secondary | ICD-10-CM

## 2016-01-07 DIAGNOSIS — L89323 Pressure ulcer of left buttock, stage 3: Secondary | ICD-10-CM

## 2016-01-25 DIAGNOSIS — L89623 Pressure ulcer of left heel, stage 3: Secondary | ICD-10-CM

## 2016-01-27 DIAGNOSIS — F39 Unspecified mood [affective] disorder: Secondary | ICD-10-CM

## 2016-01-27 DIAGNOSIS — L89309 Pressure ulcer of unspecified buttock, unspecified stage: Secondary | ICD-10-CM

## 2016-01-27 DIAGNOSIS — I2510 Atherosclerotic heart disease of native coronary artery without angina pectoris: Secondary | ICD-10-CM

## 2016-01-27 DIAGNOSIS — E039 Hypothyroidism, unspecified: Secondary | ICD-10-CM

## 2016-01-27 DIAGNOSIS — N401 Enlarged prostate with lower urinary tract symptoms: Secondary | ICD-10-CM

## 2016-01-27 DIAGNOSIS — G309 Alzheimer's disease, unspecified: Secondary | ICD-10-CM

## 2016-01-27 DIAGNOSIS — I358 Other nonrheumatic aortic valve disorders: Secondary | ICD-10-CM

## 2016-02-12 DIAGNOSIS — L309 Dermatitis, unspecified: Secondary | ICD-10-CM | POA: Diagnosis not present

## 2016-03-29 DIAGNOSIS — E441 Mild protein-calorie malnutrition: Secondary | ICD-10-CM

## 2016-03-29 DIAGNOSIS — I700 Atherosclerosis of aorta: Secondary | ICD-10-CM

## 2016-03-29 DIAGNOSIS — E039 Hypothyroidism, unspecified: Secondary | ICD-10-CM

## 2016-03-29 DIAGNOSIS — I2510 Atherosclerotic heart disease of native coronary artery without angina pectoris: Secondary | ICD-10-CM

## 2016-03-29 DIAGNOSIS — G301 Alzheimer's disease with late onset: Secondary | ICD-10-CM | POA: Diagnosis not present

## 2016-03-29 DIAGNOSIS — N400 Enlarged prostate without lower urinary tract symptoms: Secondary | ICD-10-CM

## 2016-05-17 DIAGNOSIS — R05 Cough: Secondary | ICD-10-CM | POA: Diagnosis not present

## 2016-06-29 DIAGNOSIS — R0982 Postnasal drip: Secondary | ICD-10-CM | POA: Diagnosis not present

## 2016-07-15 DIAGNOSIS — L989 Disorder of the skin and subcutaneous tissue, unspecified: Secondary | ICD-10-CM

## 2016-08-12 DIAGNOSIS — N400 Enlarged prostate without lower urinary tract symptoms: Secondary | ICD-10-CM

## 2016-08-12 DIAGNOSIS — G301 Alzheimer's disease with late onset: Secondary | ICD-10-CM

## 2016-08-12 DIAGNOSIS — I2510 Atherosclerotic heart disease of native coronary artery without angina pectoris: Secondary | ICD-10-CM

## 2016-08-12 DIAGNOSIS — E441 Mild protein-calorie malnutrition: Secondary | ICD-10-CM

## 2016-08-12 DIAGNOSIS — I350 Nonrheumatic aortic (valve) stenosis: Secondary | ICD-10-CM

## 2016-10-07 DIAGNOSIS — E43 Unspecified severe protein-calorie malnutrition: Secondary | ICD-10-CM

## 2016-10-07 DIAGNOSIS — G309 Alzheimer's disease, unspecified: Secondary | ICD-10-CM

## 2016-10-07 DIAGNOSIS — E039 Hypothyroidism, unspecified: Secondary | ICD-10-CM

## 2016-10-07 DIAGNOSIS — I2510 Atherosclerotic heart disease of native coronary artery without angina pectoris: Secondary | ICD-10-CM

## 2016-10-07 DIAGNOSIS — N401 Enlarged prostate with lower urinary tract symptoms: Secondary | ICD-10-CM

## 2016-10-07 DIAGNOSIS — I350 Nonrheumatic aortic (valve) stenosis: Secondary | ICD-10-CM

## 2016-11-21 DIAGNOSIS — H10022 Other mucopurulent conjunctivitis, left eye: Secondary | ICD-10-CM | POA: Diagnosis not present

## 2016-12-15 DIAGNOSIS — I350 Nonrheumatic aortic (valve) stenosis: Secondary | ICD-10-CM | POA: Diagnosis not present

## 2016-12-15 DIAGNOSIS — N400 Enlarged prostate without lower urinary tract symptoms: Secondary | ICD-10-CM

## 2016-12-15 DIAGNOSIS — G301 Alzheimer's disease with late onset: Secondary | ICD-10-CM

## 2016-12-15 DIAGNOSIS — E440 Moderate protein-calorie malnutrition: Secondary | ICD-10-CM

## 2016-12-15 DIAGNOSIS — I2510 Atherosclerotic heart disease of native coronary artery without angina pectoris: Secondary | ICD-10-CM

## 2016-12-27 DIAGNOSIS — H01021 Squamous blepharitis right upper eyelid: Secondary | ICD-10-CM | POA: Diagnosis not present

## 2017-01-11 DIAGNOSIS — J9600 Acute respiratory failure, unspecified whether with hypoxia or hypercapnia: Secondary | ICD-10-CM | POA: Diagnosis not present

## 2017-01-11 DIAGNOSIS — R197 Diarrhea, unspecified: Secondary | ICD-10-CM | POA: Diagnosis not present

## 2017-01-11 DIAGNOSIS — R509 Fever, unspecified: Secondary | ICD-10-CM | POA: Diagnosis not present

## 2017-01-16 ENCOUNTER — Other Ambulatory Visit: Payer: Self-pay | Admitting: Internal Medicine

## 2017-01-16 MED ORDER — MORPHINE SULFATE (CONCENTRATE) 20 MG/ML PO SOLN: ORAL | 0 refills | Status: AC

## 2017-01-18 ENCOUNTER — Telehealth: Payer: Self-pay

## 2017-02-17 NOTE — Telephone Encounter (Signed)
PLEASE NOTE: All timestamps contained within this report are represented as [place] Standard Time. CONFIDENTIALTY NOTICE: This fax transmission is intended only for the addressee. It contains information that is legally privileged, confidential or otherwise protected from use or disclosure. If you are not the intended recipient, you are strictly prohibited from reviewing, disclosing, copying using or disseminating any of this information or taking any action in reliance on or regarding this information. If you have received this fax in error, please notify us immediately by telephone so that we can arrange for its return to [place]. Phone: [phone], Toll-Free: [phone], Fax: [phone] Page: 1 of 1 Call Id: [id number] [facility] [facility] Night - Client Nonclinical Telephone Record [facility] Client [facility] [facility] Night - Client Client Site [facility] [place] - Night Physician Tillman Abide - MD Contact Type Call Who Is Calling Physician / Provider / Hospital Call Type Provider Call [facility] Page Now Reason for Call Request for Orders Initial Comment [facility] needs to speak to the on call for orders to release a pt body to the funeral home Additional Comment Patient Name Jerome Hines Patient DOB [birth year] Requesting Provider [facility] Physician Number [phone] Facility Name [facility] Phone DateTime Result/Outcome Message Type Notes Hillard Danker- MD [phone] 17-Feb-2017 1:34:56 AM Called On Call Provider - Reached Doctor Paged Hillard Danker- MD 02-17-2017 1:35:00 AM Spoke with On Call - General Message Result Call Closed By: Vicki Mallet Transaction Date/Time: Feb 17, 2017 1:27:19 AM (ET)

## 2017-02-17 NOTE — Telephone Encounter (Signed)
Noted.  This was expected...

## 2017-02-17 DEATH — deceased
